# Patient Record
Sex: Male | Born: 1955 | Race: Black or African American | Hispanic: No | Marital: Single | Smoking: Current every day smoker
Health system: Southern US, Community
[De-identification: ages and names within clinical notes are randomized; demographics above are authoritative.]

---

## 2004-11-20 ENCOUNTER — Emergency Department (HOSPITAL_COMMUNITY): Admission: EM | Admit: 2004-11-20 | Discharge: 2004-11-20 | Payer: Self-pay | Admitting: Emergency Medicine

## 2010-08-07 ENCOUNTER — Emergency Department (HOSPITAL_COMMUNITY)
Admission: EM | Admit: 2010-08-07 | Discharge: 2010-08-07 | Disposition: A | Discharge status: Home / Self Care | Payer: Self-pay | Attending: Emergency Medicine | Admitting: Emergency Medicine

## 2010-08-07 DIAGNOSIS — M549 Dorsalgia, unspecified: Secondary | ICD-10-CM | POA: Insufficient documentation

## 2010-08-07 DIAGNOSIS — G8929 Other chronic pain: Secondary | ICD-10-CM | POA: Insufficient documentation

## 2010-08-07 DIAGNOSIS — Z79899 Other long term (current) drug therapy: Secondary | ICD-10-CM | POA: Insufficient documentation

## 2010-08-07 DIAGNOSIS — F329 Major depressive disorder, single episode, unspecified: Secondary | ICD-10-CM | POA: Insufficient documentation

## 2010-08-07 DIAGNOSIS — F3289 Other specified depressive episodes: Secondary | ICD-10-CM | POA: Insufficient documentation

## 2010-08-07 DIAGNOSIS — E7800 Pure hypercholesterolemia, unspecified: Secondary | ICD-10-CM | POA: Insufficient documentation

## 2010-08-07 DIAGNOSIS — K089 Disorder of teeth and supporting structures, unspecified: Secondary | ICD-10-CM | POA: Insufficient documentation

## 2010-08-10 ENCOUNTER — Emergency Department (HOSPITAL_COMMUNITY)
Admission: EM | Admit: 2010-08-10 | Discharge: 2010-08-10 | Disposition: A | Discharge status: Home / Self Care | Payer: Self-pay | Attending: Emergency Medicine | Admitting: Emergency Medicine

## 2010-08-10 DIAGNOSIS — K089 Disorder of teeth and supporting structures, unspecified: Secondary | ICD-10-CM | POA: Insufficient documentation

## 2010-08-10 DIAGNOSIS — F329 Major depressive disorder, single episode, unspecified: Secondary | ICD-10-CM | POA: Insufficient documentation

## 2010-08-10 DIAGNOSIS — M549 Dorsalgia, unspecified: Secondary | ICD-10-CM | POA: Insufficient documentation

## 2010-08-10 DIAGNOSIS — F3289 Other specified depressive episodes: Secondary | ICD-10-CM | POA: Insufficient documentation

## 2010-08-10 DIAGNOSIS — E7800 Pure hypercholesterolemia, unspecified: Secondary | ICD-10-CM | POA: Insufficient documentation

## 2010-08-10 DIAGNOSIS — G8929 Other chronic pain: Secondary | ICD-10-CM | POA: Insufficient documentation

## 2011-12-16 ENCOUNTER — Encounter (HOSPITAL_COMMUNITY): Payer: Self-pay | Admitting: Emergency Medicine

## 2011-12-16 ENCOUNTER — Emergency Department (HOSPITAL_COMMUNITY)
Admission: EM | Admit: 2011-12-16 | Discharge: 2011-12-17 | Disposition: A | Discharge status: Home / Self Care | Payer: Self-pay | Attending: Emergency Medicine | Admitting: Emergency Medicine

## 2011-12-16 DIAGNOSIS — G500 Trigeminal neuralgia: Secondary | ICD-10-CM | POA: Insufficient documentation

## 2011-12-16 DIAGNOSIS — F17200 Nicotine dependence, unspecified, uncomplicated: Secondary | ICD-10-CM | POA: Insufficient documentation

## 2011-12-16 NOTE — ED Notes (Signed)
Pt reports has no teeth and has been wearing dentures for 5 years; reports severe pain to R upper gum X 3 days

## 2011-12-17 MED ORDER — CARBAMAZEPINE 200 MG PO TABS
200.0000 mg | ORAL_TABLET | Freq: Once | ORAL | Status: AC
  Administered 2011-12-17: 200 mg via ORAL
  Filled 2011-12-17: qty 1

## 2011-12-17 MED ORDER — CARBAMAZEPINE 200 MG PO TABS: 200.0000 mg | ORAL_TABLET | Freq: Two times a day (BID) | ORAL | Status: DC

## 2011-12-17 NOTE — ED Provider Notes (Signed)
Medical screening examination/treatment/procedure(s) were performed by non-physician practitioner and as supervising physician I was immediately available for consultation/collaboration.  Jasmine Awe, MD 12/17/11 [phone]

## 2011-12-17 NOTE — ED Provider Notes (Signed)
History     CSN: [id number]  Arrival date & time 12/16/11  2103   First MD Initiated Contact with Patient 12/16/11 2347      Chief Complaint  Patient presents with  . Dental Pain    (Consider location/radiation/quality/duration/timing/severity/associated sxs/prior treatment) HPI Comments: Patient reports, that he has a sharp, shooting, electric pain.  That starts in his scalp.  Radiates to his face, along the cheek bone and mandible.  It lasts for several seconds then resolves, it can happen repetitively throughout the day.  Patient denies any recent illnesses, or trauma.  Patient is a 56 y.o. male presenting with tooth pain. The history is provided by the patient.  Dental PainPrimary symptoms do not include mouth pain, dental injury, oral lesions, headaches, fever or sore throat.  Additional symptoms do not include: facial swelling, trouble swallowing, drooling, ear pain and hearing loss.    History reviewed. No pertinent past medical history.  History reviewed. No pertinent past surgical history.  History reviewed. No pertinent family history.  History  Substance Use Topics  . Smoking status: Current Every Day Smoker -- 0.5 packs/day    Types: Cigarettes  . Smokeless tobacco: Not on file  . Alcohol Use: Yes     occasion      Review of Systems  Constitutional: Negative for fever and chills.  HENT: Negative for hearing loss, ear pain, sore throat, facial swelling, drooling, mouth sores, trouble swallowing, neck pain, neck stiffness, dental problem, voice change, tinnitus and ear discharge.   Eyes: Negative for pain and visual disturbance.  Neurological: Negative for dizziness, weakness, numbness and headaches.    Allergies  Penicillins  Home Medications   Current Outpatient Rx  Name Route Sig Dispense Refill  . IBUPROFEN 200 MG PO TABS Oral Take 400 mg by mouth every 6 (six) hours as needed. For pain    . LORATADINE 10 MG PO TABS Oral Take 10 mg by mouth daily.     Marland Kitchen CARBAMAZEPINE 200 MG PO TABS Oral Take 1 tablet (200 mg total) by mouth 2 (two) times daily. 30 tablet 0    Take 200 mg twice a day for 7 days, then increase  ...    BP 149/99  Pulse 85  Temp 98.7 F (37.1 C) (Oral)  Resp 18  SpO2 95%  Physical Exam  Constitutional: He is oriented to person, place, and time. He appears well-developed. He appears distressed.  Eyes: Pupils are equal, round, and reactive to light.  Neck: Normal range of motion.  Cardiovascular: Normal rate.   Pulmonary/Chest: Effort normal.  Genitourinary: Penis normal.  Musculoskeletal: Normal range of motion.  Neurological: He is alert and oriented to person, place, and time. No cranial nerve deficit.  Skin: Skin is warm and dry. No erythema.    ED Course  Procedures (including critical care time)  Labs Reviewed - No data to display No results found.   1. Trigeminal neuralgia syndrome       MDM   Over the next several weeks.  [facility] I encouraged him to make a point with [facility] to [facility] an [facility], and further diagnostic tests        Arman Filter, NP 12/17/11 0018

## 2012-09-04 ENCOUNTER — Emergency Department (HOSPITAL_COMMUNITY)
Admission: EM | Admit: 2012-09-04 | Discharge: 2012-09-04 | Disposition: A | Discharge status: Home / Self Care | Payer: Self-pay | Attending: Emergency Medicine | Admitting: Emergency Medicine

## 2012-09-04 ENCOUNTER — Emergency Department (HOSPITAL_COMMUNITY): Payer: Self-pay

## 2012-09-04 ENCOUNTER — Encounter (HOSPITAL_COMMUNITY): Payer: Self-pay | Admitting: *Deleted

## 2012-09-04 DIAGNOSIS — R209 Unspecified disturbances of skin sensation: Secondary | ICD-10-CM | POA: Insufficient documentation

## 2012-09-04 DIAGNOSIS — W11XXXA Fall on and from ladder, initial encounter: Secondary | ICD-10-CM | POA: Insufficient documentation

## 2012-09-04 DIAGNOSIS — Z880 Allergy status to penicillin: Secondary | ICD-10-CM | POA: Insufficient documentation

## 2012-09-04 DIAGNOSIS — S4980XA Other specified injuries of shoulder and upper arm, unspecified arm, initial encounter: Secondary | ICD-10-CM | POA: Insufficient documentation

## 2012-09-04 DIAGNOSIS — M62838 Other muscle spasm: Secondary | ICD-10-CM | POA: Insufficient documentation

## 2012-09-04 DIAGNOSIS — Y9289 Other specified places as the place of occurrence of the external cause: Secondary | ICD-10-CM | POA: Insufficient documentation

## 2012-09-04 DIAGNOSIS — S46909A Unspecified injury of unspecified muscle, fascia and tendon at shoulder and upper arm level, unspecified arm, initial encounter: Secondary | ICD-10-CM | POA: Insufficient documentation

## 2012-09-04 DIAGNOSIS — Y9389 Activity, other specified: Secondary | ICD-10-CM | POA: Insufficient documentation

## 2012-09-04 DIAGNOSIS — F17200 Nicotine dependence, unspecified, uncomplicated: Secondary | ICD-10-CM | POA: Insufficient documentation

## 2012-09-04 MED ORDER — DIAZEPAM 5 MG PO TABS: 5.0000 mg | ORAL_TABLET | Freq: Four times a day (QID) | ORAL | Status: DC | PRN

## 2012-09-04 MED ORDER — OXYCODONE-ACETAMINOPHEN 5-325 MG PO TABS: 2.0000 | ORAL_TABLET | Freq: Once | ORAL | Status: DC

## 2012-09-04 MED ORDER — OXYCODONE-ACETAMINOPHEN 5-325 MG PO TABS
1.0000 | ORAL_TABLET | Freq: Once | ORAL | Status: AC
  Administered 2012-09-04: 1 via ORAL
  Filled 2012-09-04: qty 1

## 2012-09-04 MED ORDER — OXYCODONE-ACETAMINOPHEN 5-325 MG PO TABS
1.0000 | ORAL_TABLET | ORAL | Status: DC | PRN
Start: 2012-09-04 — End: 2013-06-07

## 2012-09-04 NOTE — ED Provider Notes (Signed)
History    CSN: [id number] Arrival date & time 09/04/12  2144  First MD Initiated Contact with Patient 09/04/12 2234     Chief Complaint  Patient presents with  . Shoulder Injury   HPI  History provided by the patient. Patient is a 57 year old male with no significant PMH who presents with complaints of bilateral upper arm pain and numbness feeling. Patient reports that he was doing work outside standing on his 16 foot ladder when he suddenly lost balance and fell to his right side. Patient was unable to extend his arms and landed directly onto his right shoulder. Injury occurred 2 days ago. Patient did have some immediate soreness to his right shoulder and on. He has been resting this but after awaking this morning he began having pain in both shoulders and upper back area. He also complained of pain in the tingling and burning sensation running down both arms and into the fingers. Patient did attempt to use some over-the-counter medicine but this did not help significantly. Patient also states that he has prescriptions for stronger medicines for pain which he also attempted to use did not help. Patient's wife believes this was hydrocodone. He denies any other aggravating or alleviating factors. He denies any neck pains or worsened symptoms with movements of his neck or back. Patient denies any significant head injury and denies any LOC from the fall.  No past medical history on file. No past surgical history on file. No family history on file. History  Substance Use Topics  . Smoking status: Current Every Day Smoker -- 0.50 packs/day    Types: Cigarettes  . Smokeless tobacco: Not on file  . Alcohol Use: Yes     Comment: occasion    Review of Systems  HENT: Negative for neck pain and neck stiffness.   Cardiovascular: Negative for chest pain.  Musculoskeletal: Negative for back pain.  Neurological: Positive for numbness.  All other systems reviewed and are negative.    Allergies    Penicillins  Home Medications   Current Outpatient Rx  Name  Route  Sig  Dispense  Refill  . loratadine (CLARITIN) 10 MG tablet   Oral   Take 10 mg by mouth daily.         Marland Kitchen PRESCRIPTION MEDICATION   Oral   Take 1 tablet by mouth every 6 (six) hours as needed (Pain Medication).          BP 134/83  Pulse 95  Temp(Src) 98.8 F (37.1 C) (Oral)  Resp 16  SpO2 98% Physical Exam  Nursing note and vitals reviewed. Constitutional: He is oriented to person, place, and time. He appears well-developed and well-nourished.  HENT:  Head: Normocephalic.  Neck: Normal range of motion. Neck supple.  No cervical midline tenderness.  Cardiovascular: Normal rate and regular rhythm.   Pulmonary/Chest: Effort normal and breath sounds normal.  Abdominal: Soft.  Musculoskeletal:       Cervical back: Normal.       Thoracic back: Normal.       Lumbar back: Normal.       Back:  Patient does have point tenderness over bilateral trapezius areas. There is no significant acute spasm. No deformity of the muscle. There is also tenderness extending into the bilateral deltoid areas without swelling or deformity. Normal a.c. joints bilaterally. Normal clavicles. Patient was significantly reduced range of motion of both arms greatest width abduction. Patient has generally good forward flexion. Slightly decreased posterior extension.  Neurological: He  is alert and oriented to person, place, and time.  1+ strengths bilaterally but equal. Normal sensation to light and sharp touch fingers. Normal radial pulses and cap refill.    ED Course  Procedures   Dg Shoulder Right  09/04/2012   *RADIOLOGY REPORT*  Clinical Data: Right shoulder pain after fall.  RIGHT SHOULDER - 2+ VIEW  Comparison: None.  Findings: No fracture or dislocation is noted.  No degenerative changes are noted.  Underlying ribs appear normal.  IMPRESSION: Normal right shoulder.   Original Report Authenticated By: Lupita Raider.,  M.D.    Dg Shoulder Left  09/04/2012   *RADIOLOGY REPORT*  Clinical Data: Left shoulder pain after fall  LEFT SHOULDER - 2+ VIEW  Comparison: None.  Findings: No fracture or dislocation is noted.  No significant degenerative changes are noted.  Underlying ribs appear normal.  IMPRESSION: Normal left shoulder.   Original Report Authenticated By: Lupita Raider.,  M.D.   1. Trapezius muscle spasm     MDM  Patient seen and evaluated. Patient sitting up appears well in no acute distress.  Patient was unremarkable x-rays 2 bilateral shoulder area. He has full range of motion of his neck and moves from sitting and standing without any difficulty. He has no spinal tenderness. Patient does seem to have significant point tenderness along the bilateral trapezius area. No gross deformity or acute spasming. Grip strengths are equal bilaterally. Normal distal pulses. At this time do not feel his symptoms are from any concerning or emergent condition. I will prescribe medications for pain and to relax the muscles. Patient also advised of other conservative treatments with plans for close followup outpatient at the [facility]. He agrees and will plan to call tomorrow for an appointment.  Angus Seller, PA-C 09/05/12 [phone]

## 2012-09-04 NOTE — ED Notes (Addendum)
Pt states that he fell off of a ladder that was approx. 16 feet tall. Pt was unable to get his hands out to catch his fall and he landed on his right shoulder. Pt is have referred pain to his left shoulder and tingling down both of his arms and to his hands. Pt states unable to lift arms or move shoulders. Pt took goody powder for pain and ice hot patches on both arms

## 2012-09-06 NOTE — ED Provider Notes (Signed)
Medical screening examination/treatment/procedure(s) were performed by non-physician practitioner and as supervising physician I was immediately available for consultation/collaboration.  Dontea Corlew T Krissa Utke, MD 09/06/12 1109 

## 2013-06-07 ENCOUNTER — Emergency Department (HOSPITAL_COMMUNITY): Payer: Non-veteran care

## 2013-06-07 ENCOUNTER — Emergency Department (HOSPITAL_COMMUNITY)
Admission: EM | Admit: 2013-06-07 | Discharge: 2013-06-07 | Disposition: A | Discharge status: Home / Self Care | Payer: Non-veteran care | Attending: Emergency Medicine | Admitting: Emergency Medicine

## 2013-06-07 ENCOUNTER — Encounter (HOSPITAL_COMMUNITY): Payer: Self-pay | Admitting: Emergency Medicine

## 2013-06-07 DIAGNOSIS — Z880 Allergy status to penicillin: Secondary | ICD-10-CM | POA: Insufficient documentation

## 2013-06-07 DIAGNOSIS — R509 Fever, unspecified: Secondary | ICD-10-CM | POA: Insufficient documentation

## 2013-06-07 DIAGNOSIS — R5383 Other fatigue: Secondary | ICD-10-CM

## 2013-06-07 DIAGNOSIS — B349 Viral infection, unspecified: Secondary | ICD-10-CM

## 2013-06-07 DIAGNOSIS — R0989 Other specified symptoms and signs involving the circulatory and respiratory systems: Secondary | ICD-10-CM | POA: Insufficient documentation

## 2013-06-07 DIAGNOSIS — J3489 Other specified disorders of nose and nasal sinuses: Secondary | ICD-10-CM | POA: Insufficient documentation

## 2013-06-07 DIAGNOSIS — Z79899 Other long term (current) drug therapy: Secondary | ICD-10-CM | POA: Insufficient documentation

## 2013-06-07 DIAGNOSIS — B9789 Other viral agents as the cause of diseases classified elsewhere: Secondary | ICD-10-CM | POA: Insufficient documentation

## 2013-06-07 DIAGNOSIS — F17200 Nicotine dependence, unspecified, uncomplicated: Secondary | ICD-10-CM | POA: Insufficient documentation

## 2013-06-07 DIAGNOSIS — R5381 Other malaise: Secondary | ICD-10-CM | POA: Insufficient documentation

## 2013-06-07 MED ORDER — IBUPROFEN 800 MG PO TABS: 800.0000 mg | ORAL_TABLET | Freq: Three times a day (TID) | ORAL | Status: AC

## 2013-06-07 MED ORDER — HYDROCODONE-HOMATROPINE 5-1.5 MG/5ML PO SYRP: 5.0000 mL | ORAL_SOLUTION | Freq: Four times a day (QID) | ORAL | Status: AC | PRN

## 2013-06-07 NOTE — ED Notes (Signed)
Pt presents to department for evaluation of cough and sinus/chest congestion. Ongoing since Wednesday. No relief with OTC medications. Respirations unlabored. Pt is alert and oriented x4.

## 2013-06-07 NOTE — Discharge Instructions (Signed)
Take ibuprofen as needed for pain. Take hycodan as needed for cough. Refer to attached documents for more information. Return to the ED with worsening or concerning symptoms.

## 2013-06-07 NOTE — ED Provider Notes (Signed)
Medical screening examination/treatment/procedure(s) were performed by non-physician practitioner and as supervising physician I was immediately available for consultation/collaboration.   EKG Interpretation None        William Kaiesha Tonner, MD 06/07/13 1435 

## 2013-06-07 NOTE — ED Provider Notes (Signed)
CSN: [id number]     Arrival date & time 06/07/13  1034 History  This chart was scribed for non-physician practitioner [name], working with Dagmar HaitWilliam Blair Walden, MD by Carl Bestelina Holson, ED Scribe. This patient was seen in room TR09C/TR09C and the patient's care was started at 11:07 AM.    Chief Complaint  Patient presents with  . Cough  . Nasal Congestion    Patient is a 58 y.o. male presenting with cough. The history is provided by the patient. No language interpreter was used.  Cough Associated symptoms: fever and myalgias    HPI Comments: Mitchell Vaughan is a 58 y.o. male who presents to the Emergency Department complaining of constant chest congestion and cough productive of white, milky sputum that started four days ago.  The patient lists fever and myalgias as associated symptoms.  He states that his highest temperature has been 101 degrees.  The patient states that he has used Alka-Seltzer Plus which alleviates his chest congestion but aggravates his cough.  He denies having a history of Pneumonia.    History reviewed. No pertinent past medical history. History reviewed. No pertinent past surgical history. History reviewed. No pertinent family history. History  Substance Use Topics  . Smoking status: Current Every Day Smoker -- 0.50 packs/day    Types: Cigarettes  . Smokeless tobacco: Not on file  . Alcohol Use: Yes     Comment: social    Review of Systems  Constitutional: Positive for fever.  HENT: Positive for congestion.   Respiratory: Positive for cough.   Musculoskeletal: Positive for myalgias.  All other systems reviewed and are negative.     Allergies  Penicillins  Home Medications   Prior to Admission medications   Medication Sig Start Date End Date Taking? Authorizing Provider  diazepam (VALIUM) 5 MG tablet Take 1 tablet (5 mg total) by mouth every 6 (six) hours as needed for anxiety (spasms). 09/04/12   Phill MutterPeter S Dammen, PA-C  loratadine (CLARITIN) 10  MG tablet Take 10 mg by mouth daily.    Historical Provider, MD  oxyCODONE-acetaminophen (PERCOCET) 5-325 MG per tablet Take 1 tablet by mouth every 4 (four) hours as needed for pain. 09/04/12   Angus SellerPeter S Dammen, PA-C  PRESCRIPTION MEDICATION Take 1 tablet by mouth every 6 (six) hours as needed (Pain Medication).    Historical Provider, MD   Triage Vitals: BP 118/68  Pulse 98  Temp(Src) 99.1 F (37.3 C)  Resp 20  Ht 6\' 4"  (1.93 m)  Wt 185 lb (83.915 kg)  BMI 22.53 kg/m2  SpO2 97%  Physical Exam  Nursing note and vitals reviewed. Constitutional: He is oriented to person, place, and time. He appears well-developed and well-nourished. No distress.  Patient coughing.   HENT:  Head: Normocephalic and atraumatic.  Eyes: Conjunctivae and EOM are normal.  Neck: Normal range of motion.  Cardiovascular: Normal rate and regular rhythm.  Exam reveals no gallop and no friction rub.   No murmur heard. Pulmonary/Chest: Effort normal and breath sounds normal. He has no wheezes. He has no rales. He exhibits no tenderness.  Abdominal: Soft. He exhibits no distension. There is no tenderness. There is no rebound.  Musculoskeletal: Normal range of motion.  Neurological: He is alert and oriented to person, place, and time.  Speech is goal-oriented. Moves limbs without ataxia.   Skin: Skin is warm and dry.  Psychiatric: He has a normal mood and affect. His behavior is normal.    ED Course  Procedures (including  critical care time)  DIAGNOSTIC STUDIES: Oxygen Saturation is 97% on room air, adequate by my interpretation.    COORDINATION OF CARE: 11:10 AM- Discussed obtaining a chest x-ray to rule out a clinical suspicion of Pneumonia.  The patient agreed to the treatment plan.   Labs Review Labs Reviewed - No data to display  Imaging Review Dg Chest 2 View  06/07/2013   CLINICAL DATA:  Cough.  Chest congestion.  Chest wall pain.  EXAM: CHEST  2 VIEW  COMPARISON:  None.  FINDINGS: The heart size  and mediastinal contours are within normal limits. Both lungs are clear. The visualized skeletal structures are unremarkable.  IMPRESSION: No active cardiopulmonary disease.   Electronically Signed   By: Myles RosenthalJohn  Stahl M.D.   On: 06/07/2013 11:25     EKG Interpretation None      MDM   Final diagnoses:  Viral illness    11:38 AM Patient's chest xray unremarkable for acute changes. Vitals stable and patient afebrile. Patient likely has a viral illness and will be discharged with hycodan for cough and ibuprofen for bodyaches. Patient advised to return with worsening or concerning symptoms.   I personally performed the services described in this documentation, which was scribed in my presence. The recorded information has been reviewed and is accurate.    [name], PA-C 06/07/13 1140

## 2014-11-26 IMAGING — CR DG SHOULDER 2+V*L*
3 series · 3 of 3 positions shown · non-contrast
Comparison: None.

CLINICAL DATA: Left shoulder pain after fall

LEFT SHOULDER - 2+ VIEW

[w shoulder ap internal left]
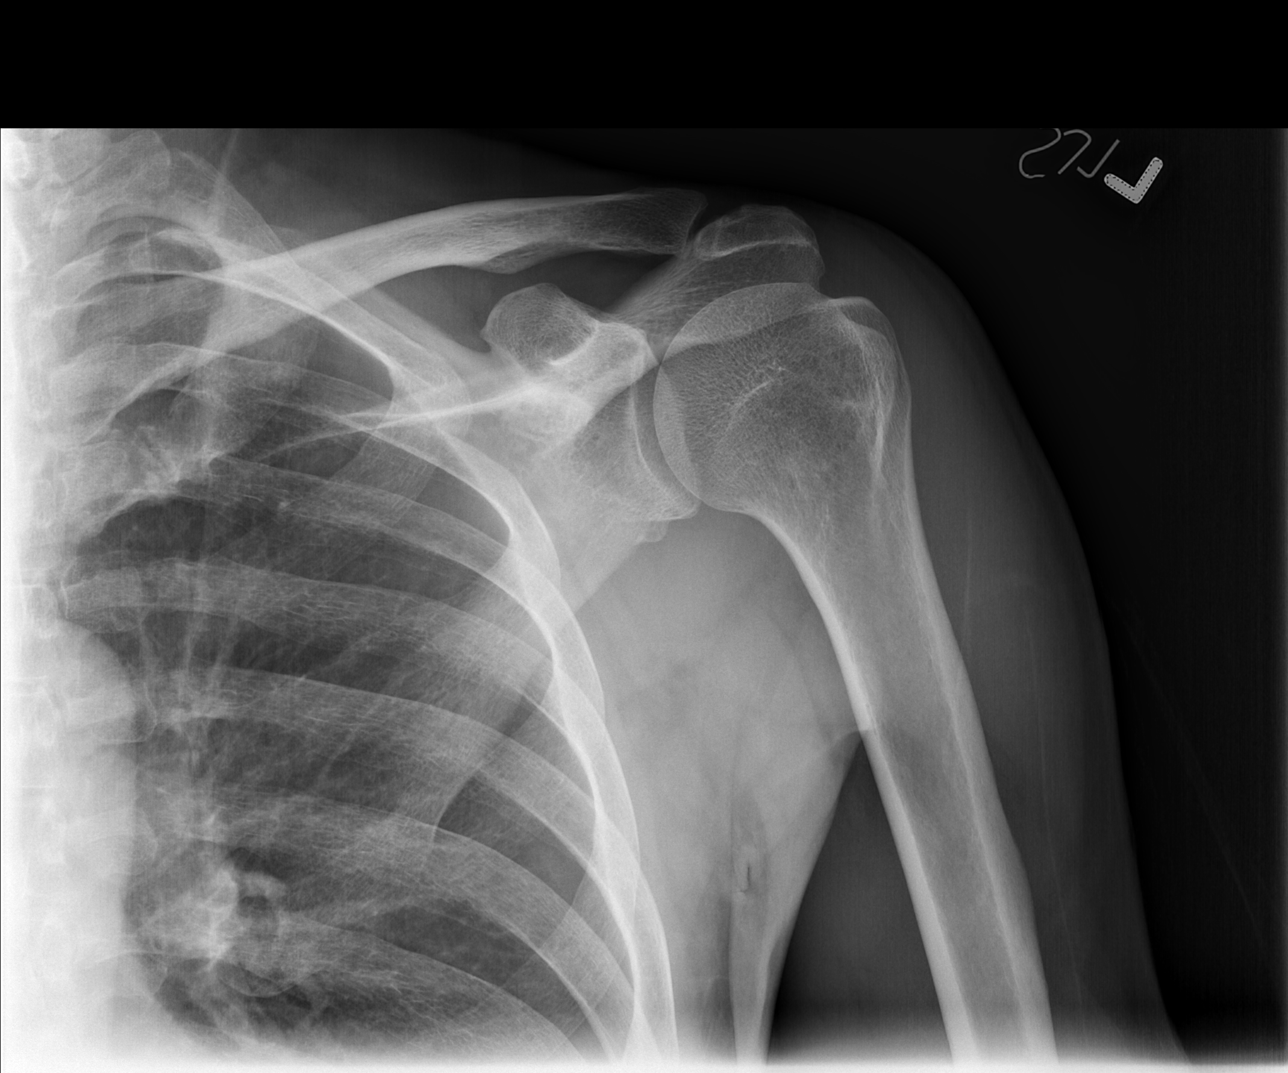

[w shoulder ap external left]
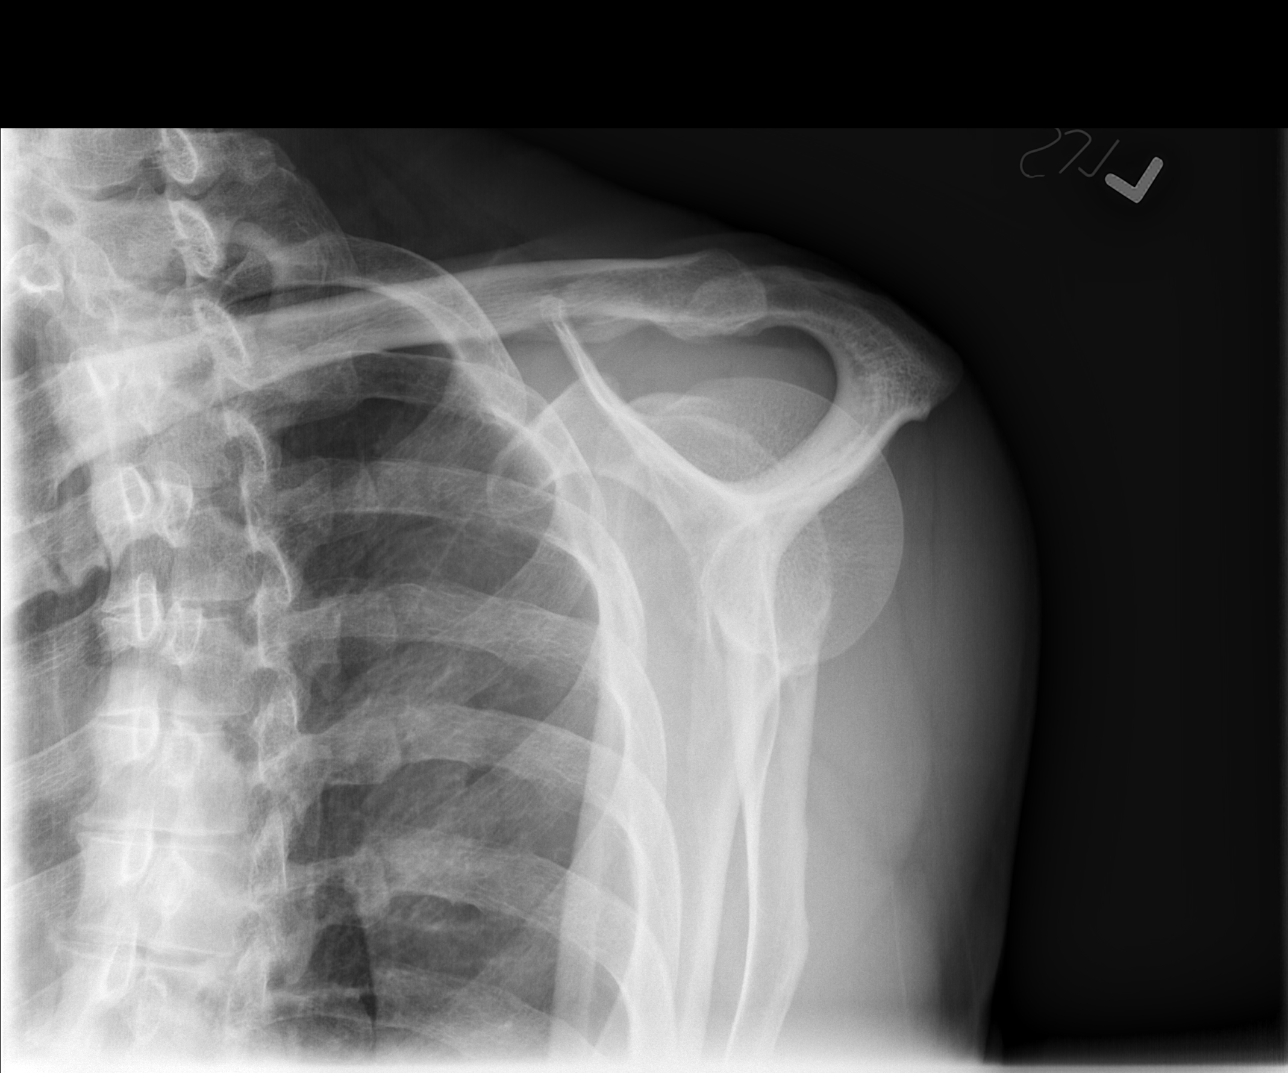

[x shoulder axillary left]
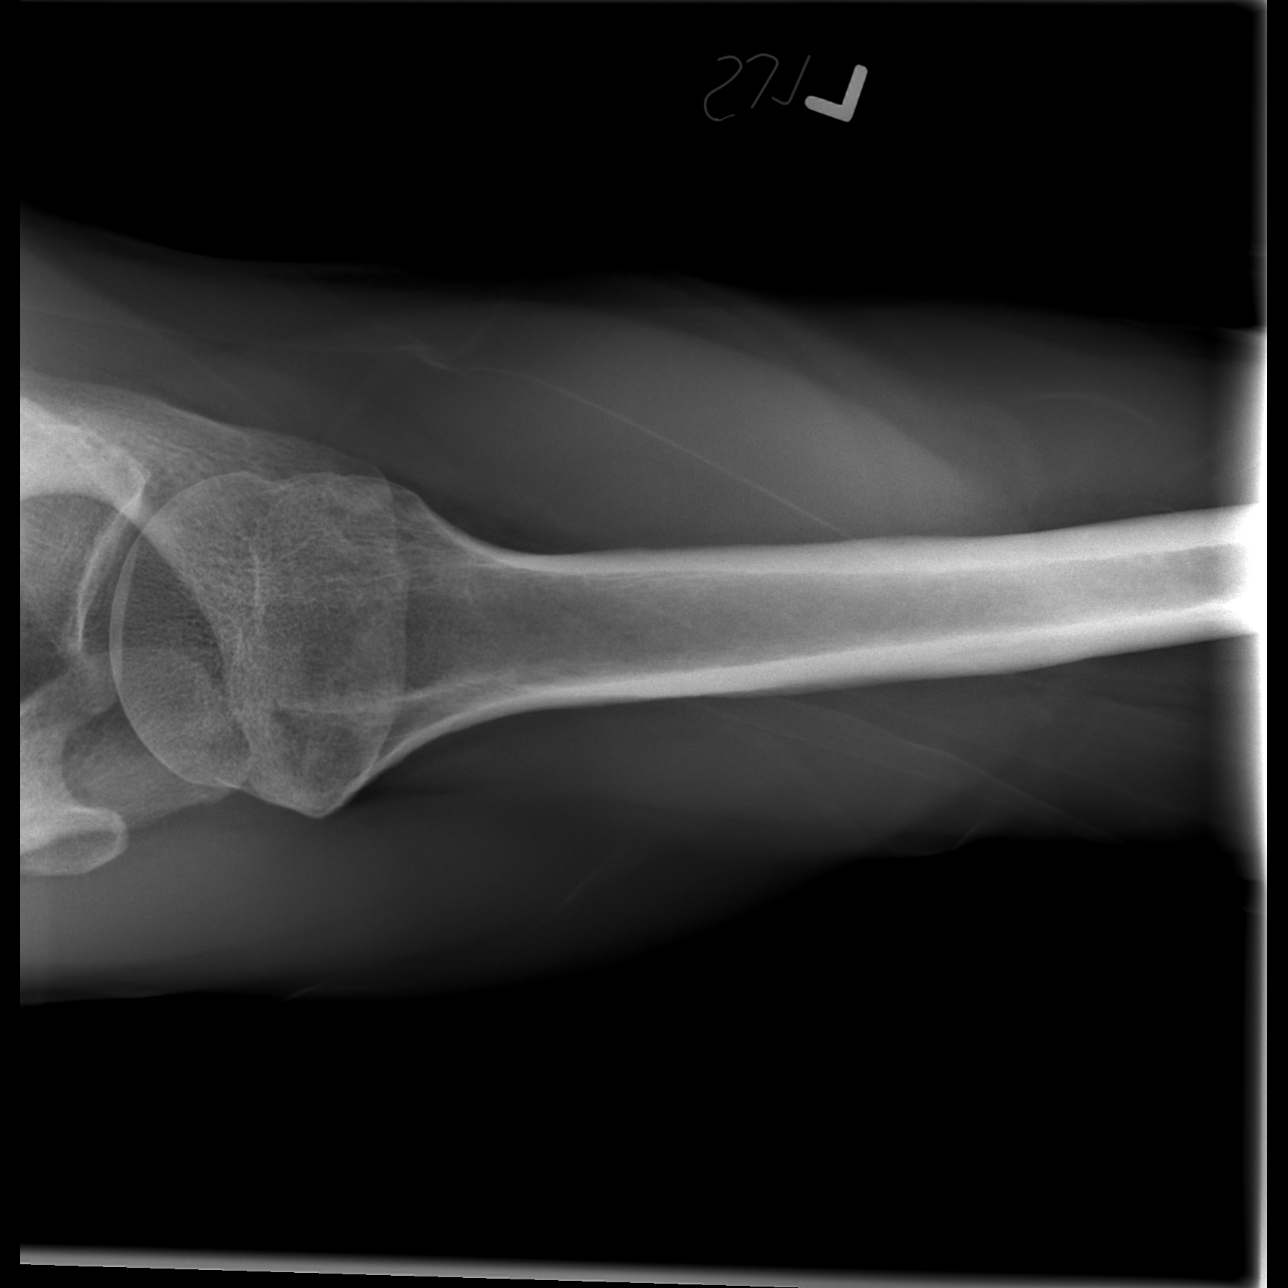

[3 of 3 positions shown; findings below may reference images not displayed]

FINDINGS: No fracture or dislocation is noted.  No significant
degenerative changes are noted.  Underlying ribs appear normal.
IMPRESSION: Normal left shoulder.

## 2015-08-29 IMAGING — CR DG CHEST 2V
2 series · 2 of 2 positions shown · non-contrast
Comparison: None.

CLINICAL DATA: Cough.  Chest congestion.  Chest wall pain.

EXAM:
CHEST  2 VIEW

[w chest pa]
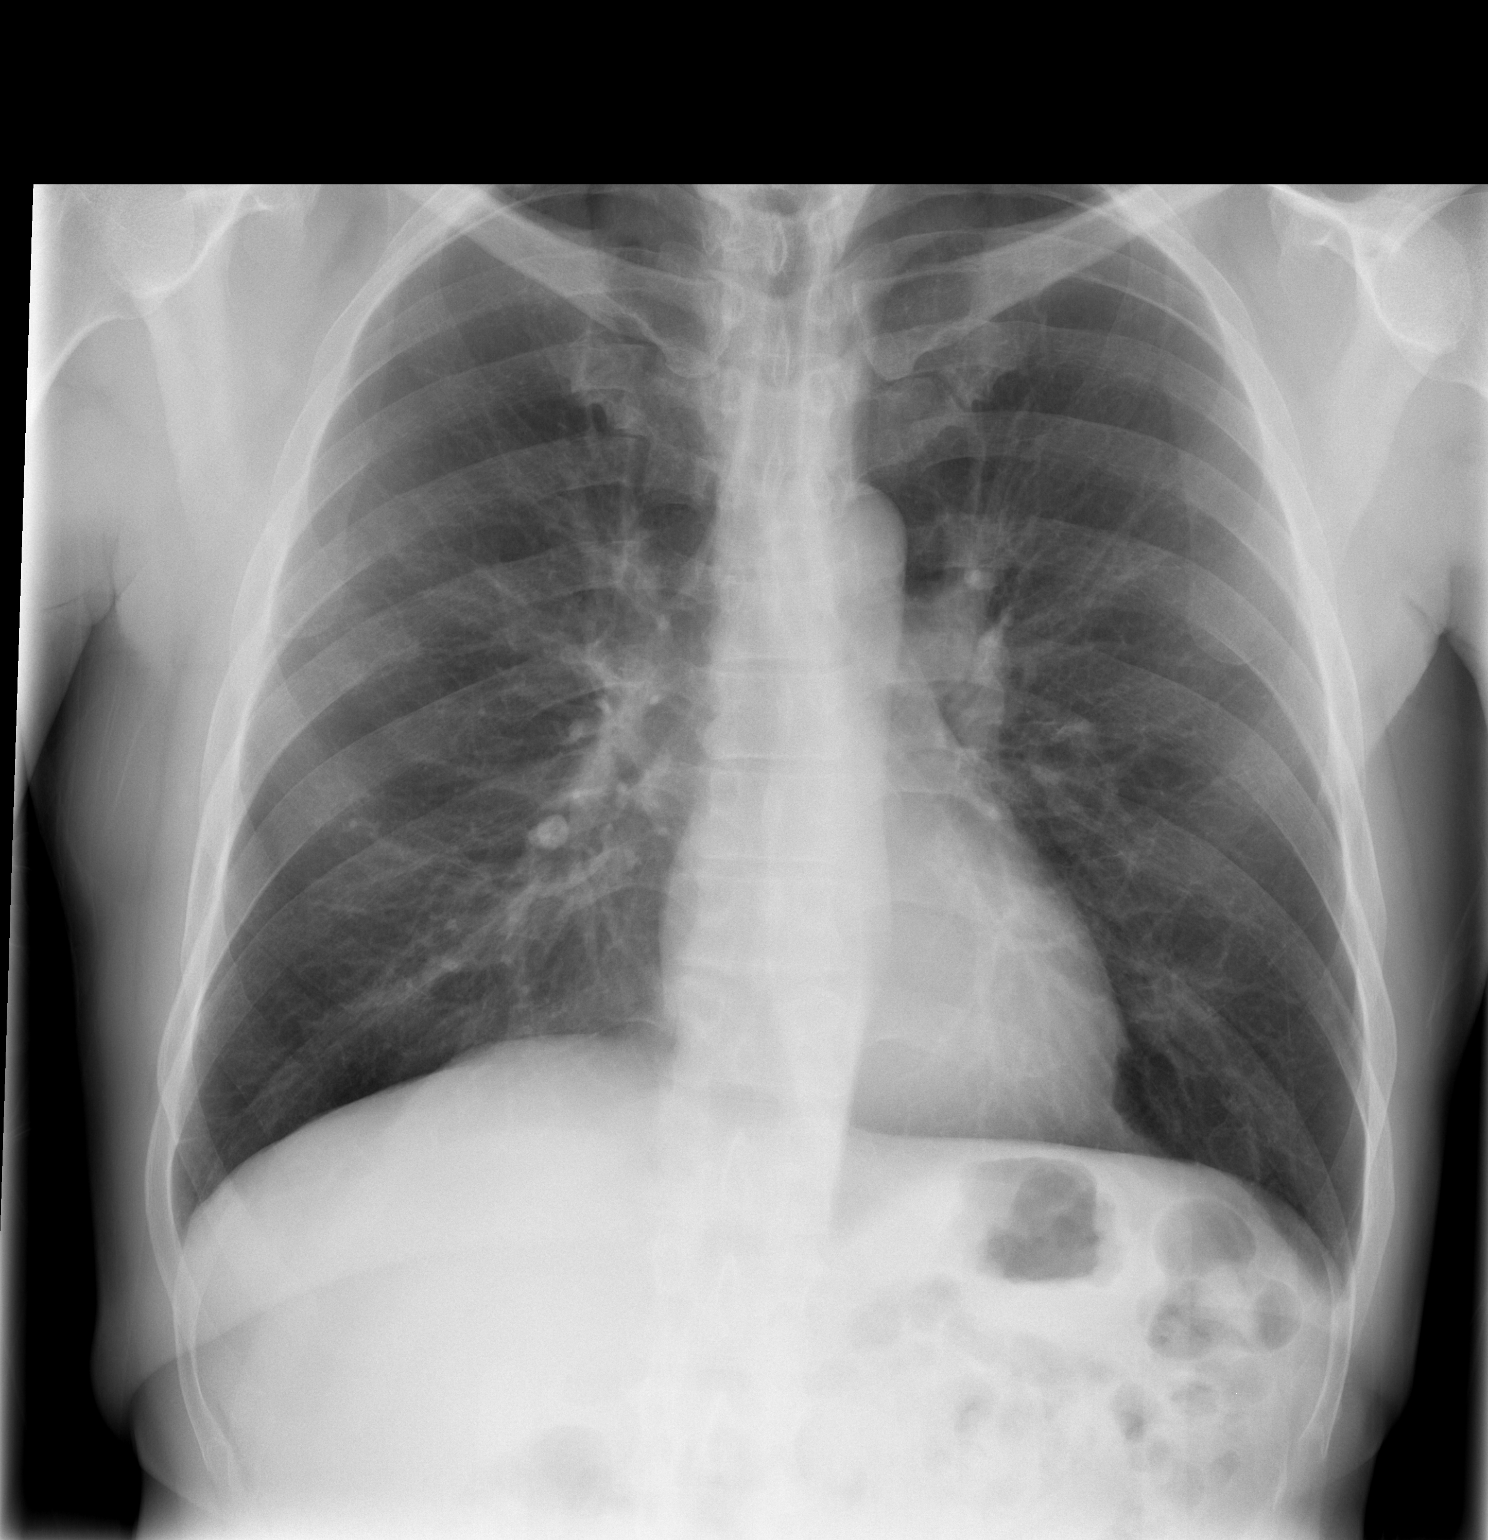

[w chest lat]
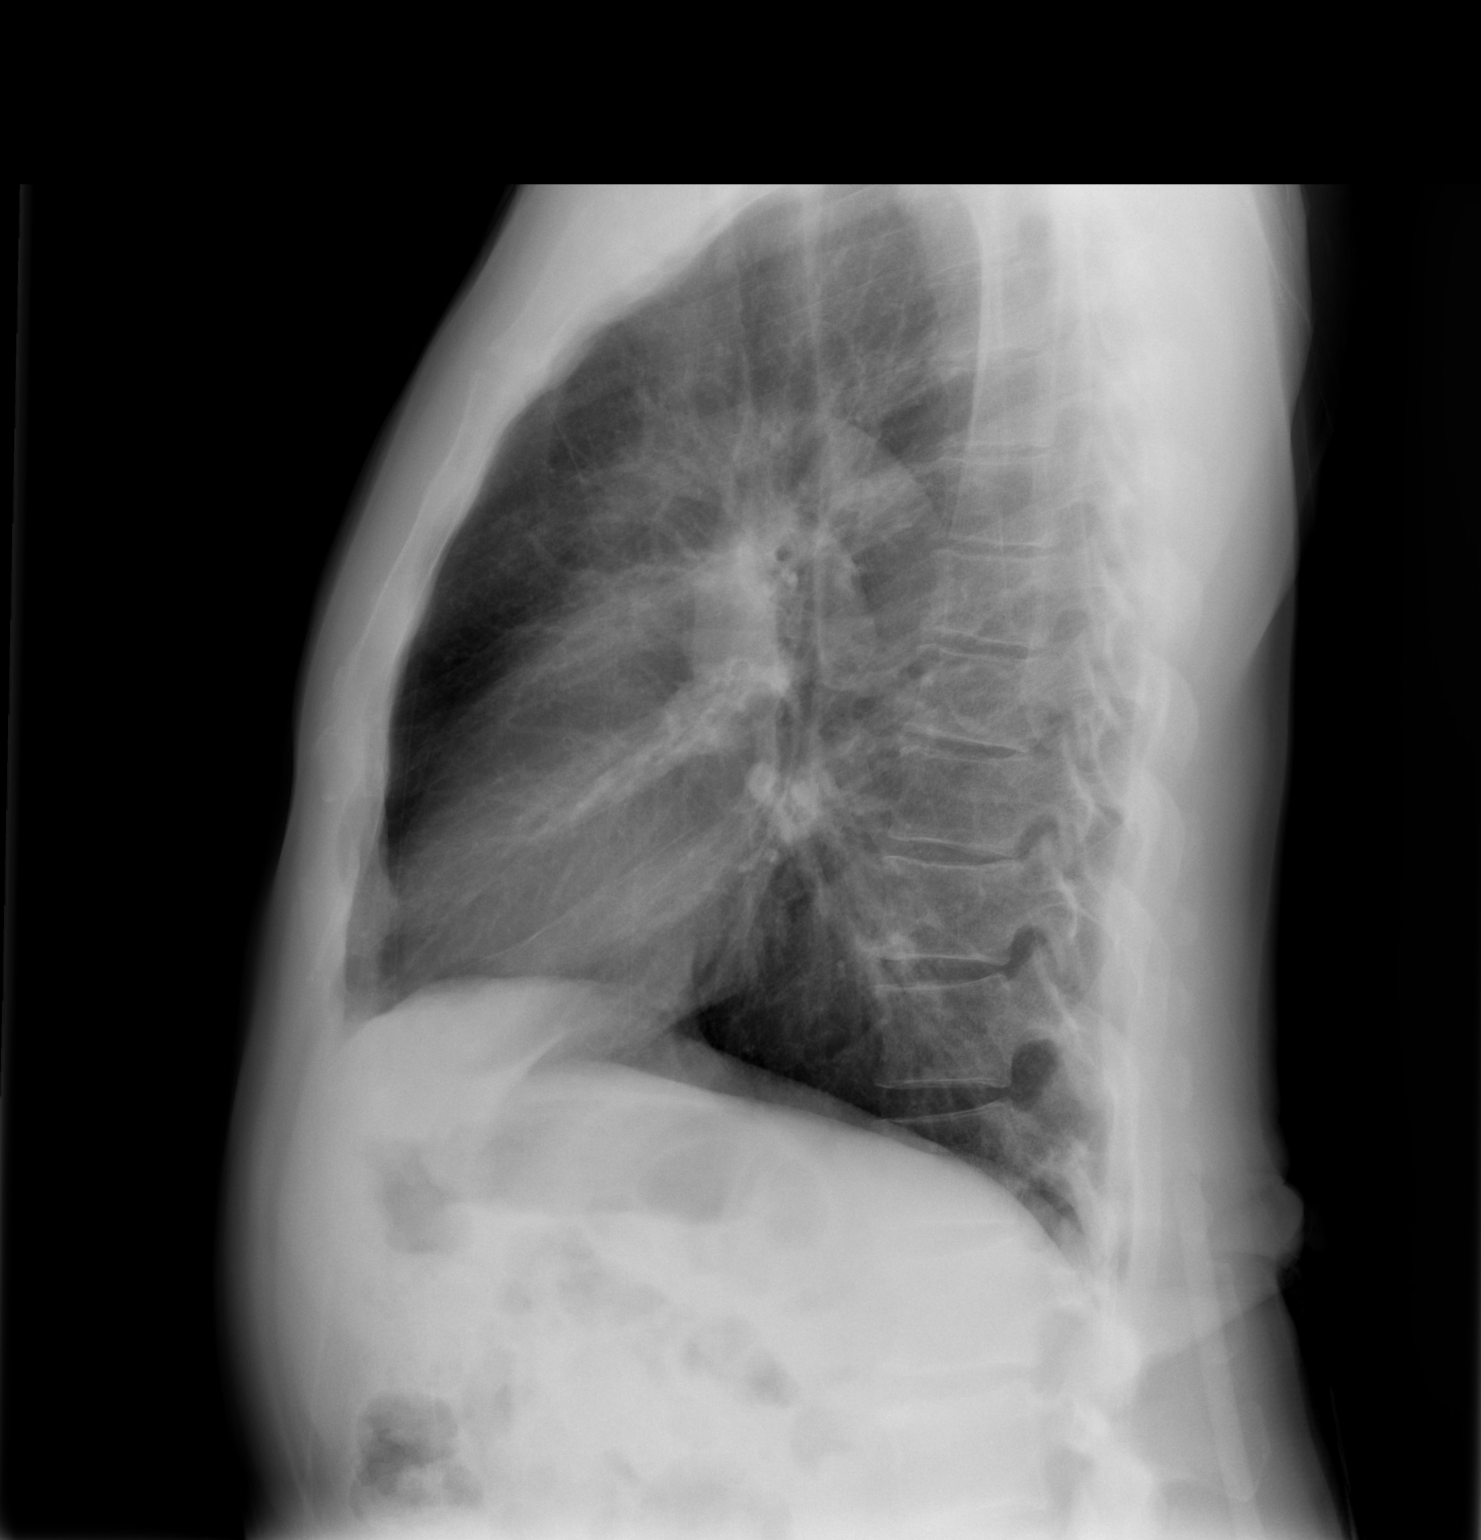

[2 of 2 positions shown; findings below may reference images not displayed]

FINDINGS: The heart size and mediastinal contours are within normal limits.
Both lungs are clear. The visualized skeletal structures are
unremarkable.
IMPRESSION: No active cardiopulmonary disease.
